# Patient Record
Sex: Male | Born: 1935 | Race: Black or African American | Hispanic: No | Marital: Single | Smoking: Heavy tobacco smoker
Health system: Southern US, Community
[De-identification: ages and names within clinical notes are randomized; demographics above are authoritative.]

## PROBLEM LIST (undated history)

## (undated) ENCOUNTER — Emergency Department (HOSPITAL_COMMUNITY): Admission: EM | Payer: Medicare Other | Source: Home / Self Care

## (undated) DIAGNOSIS — E7800 Pure hypercholesterolemia, unspecified: Secondary | ICD-10-CM

## (undated) DIAGNOSIS — I10 Essential (primary) hypertension: Secondary | ICD-10-CM

## (undated) DIAGNOSIS — N433 Hydrocele, unspecified: Secondary | ICD-10-CM

## (undated) DIAGNOSIS — M109 Gout, unspecified: Secondary | ICD-10-CM

## (undated) DIAGNOSIS — M1990 Unspecified osteoarthritis, unspecified site: Secondary | ICD-10-CM

## (undated) HISTORY — PX: BACK SURGERY: SHX140

## (undated) HISTORY — PX: HERNIA REPAIR: SHX51

---

## 2005-01-13 ENCOUNTER — Encounter: Admission: RE | Admit: 2005-01-13 | Discharge: 2005-01-13 | Payer: Self-pay | Admitting: Neurosurgery

## 2005-01-13 IMAGING — RF DG MYELOGRAM LUMBAR
13 series · 13 of 13 positions shown · IV contrast (omnipaque)
Comparison: none

CLINICAL DATA: Back and bilateral leg pain. 
 LUMBAR MYELOGRAM:
 Transitional anatomy is present, lowest disc space is considered S1-2. 
 Following informed consent, sterile preparation of the back, and adequate local anesthesia, a lumbar puncture was performed using a 22 gauge spinal needle at S1-2 in the midline.  Fluid was clear and colorless.  15 cc of Omnipaque 180 was instilled in the subarachnoid space.  AP, lateral, and oblique views demonstrate mild scoliosis convex left in the midlumbar region.  There is asymmetric loss of interspace height at L4-5 on the right and L5-S1 diffusely.  There is moderate waist-like narrowing at L3-4.  Conus medullaris is normal.

[Series 1: myelogram  white · 1 of 1 slices shown (1 of 13)]
[im 1/1]
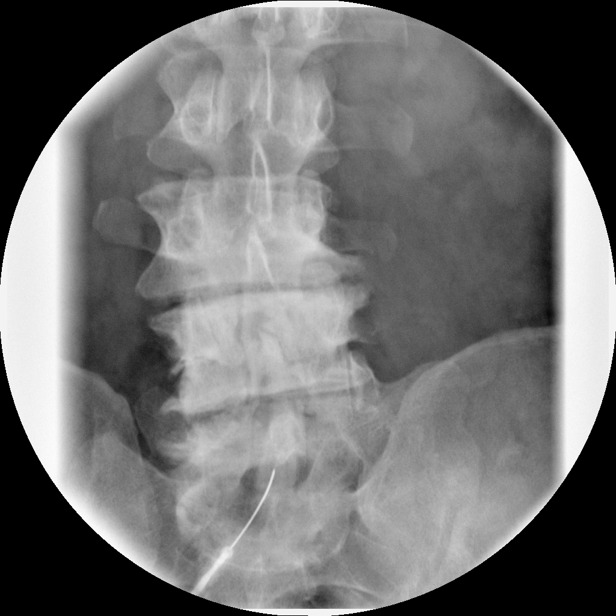

[Series 2: myelogram  white · 1 of 1 slices shown (2 of 13)]
[im 1/1]
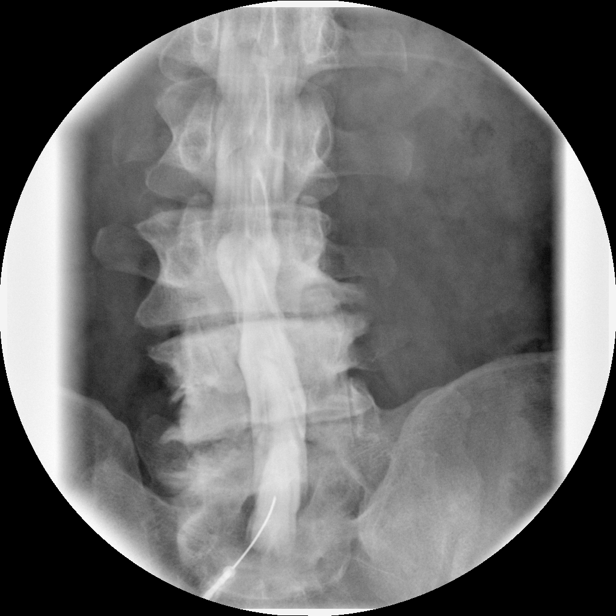

[Series 3: myelogram  white · 1 of 1 slices shown (3 of 13)]
[im 1/1]
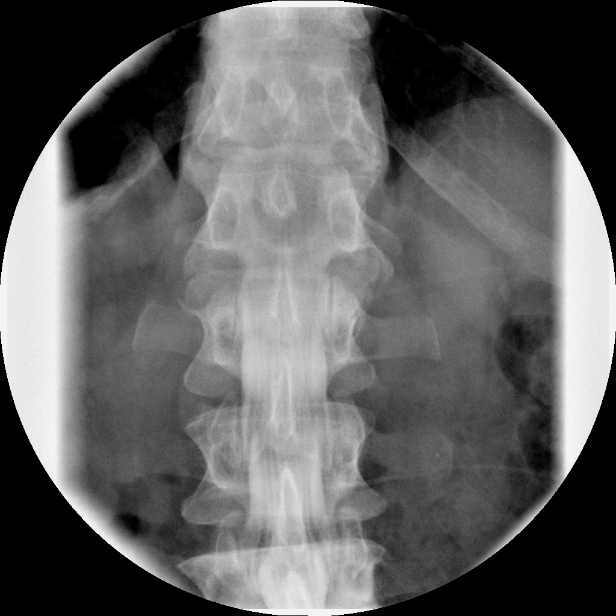

[Series 4: myelogram  white · 1 of 1 slices shown (4 of 13)]
[im 1/1]
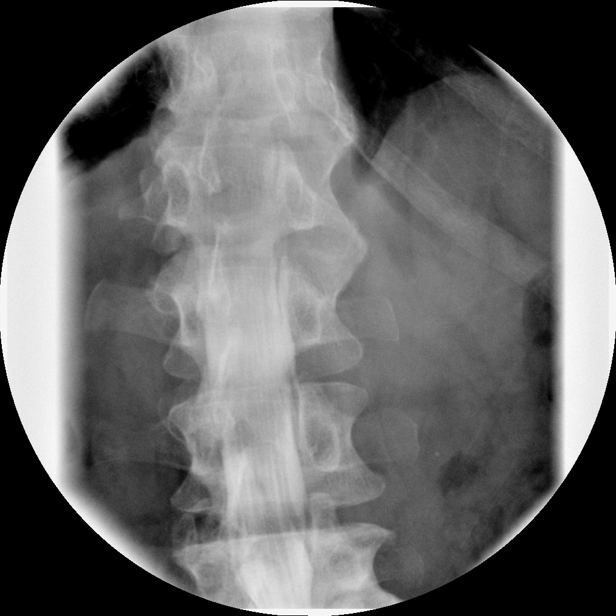

[Series 5: myelogram  white · 1 of 1 slices shown (5 of 13)]
[im 1/1]
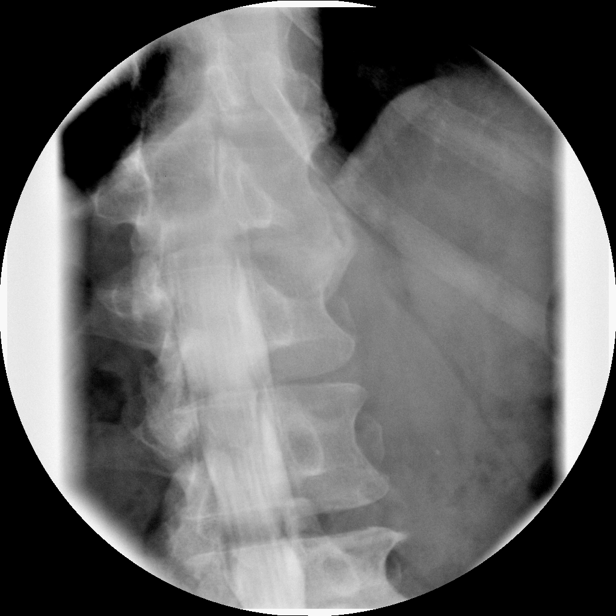

[Series 6: myelogram  white · 1 of 1 slices shown (6 of 13)]
[im 1/1]
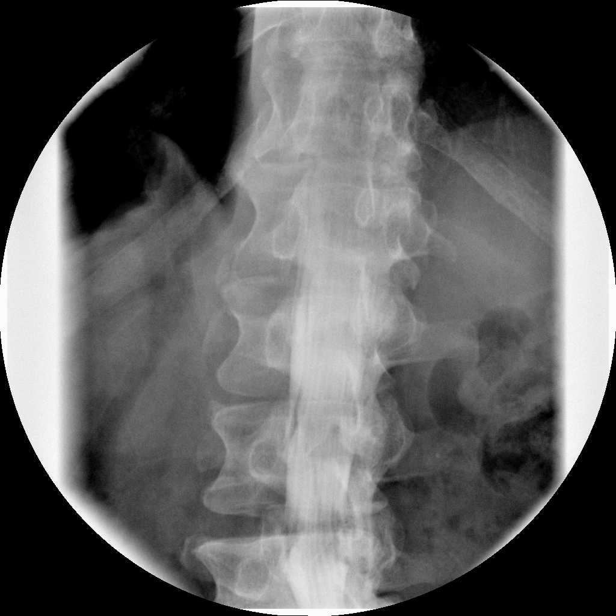

[Series 7: myelogram  white · 1 of 1 slices shown (7 of 13)]
[im 1/1]
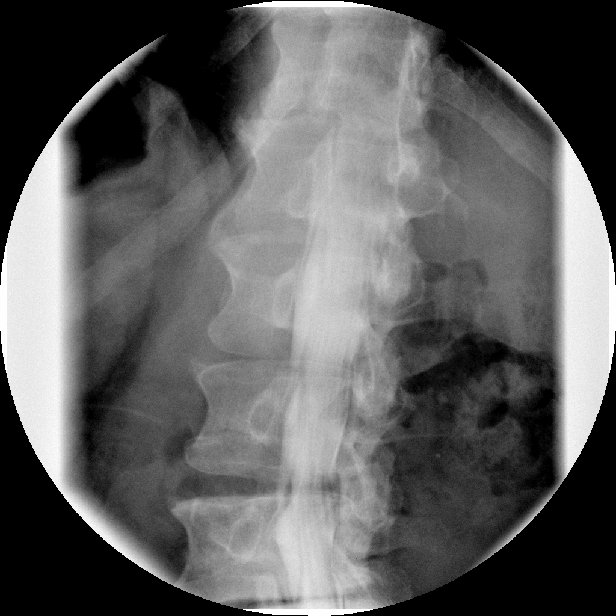

[Series 8: myelogram  white · 1 of 1 slices shown (8 of 13)]
[im 1/1]
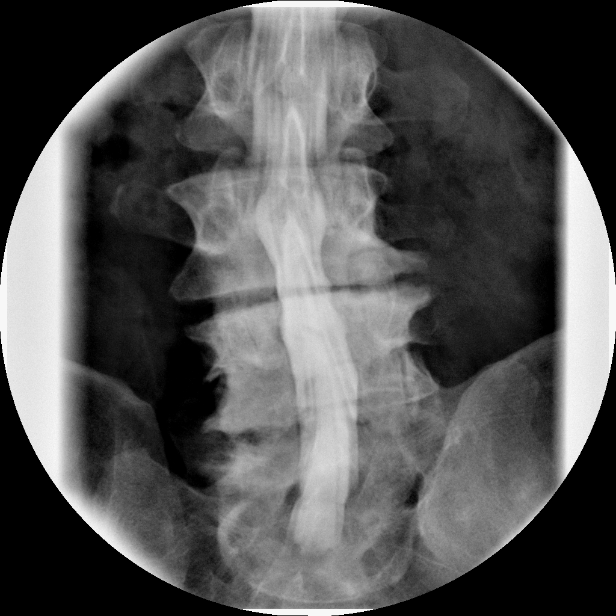

[Series 9: myelogram  white · 1 of 1 slices shown (9 of 13)]
[im 1/1]
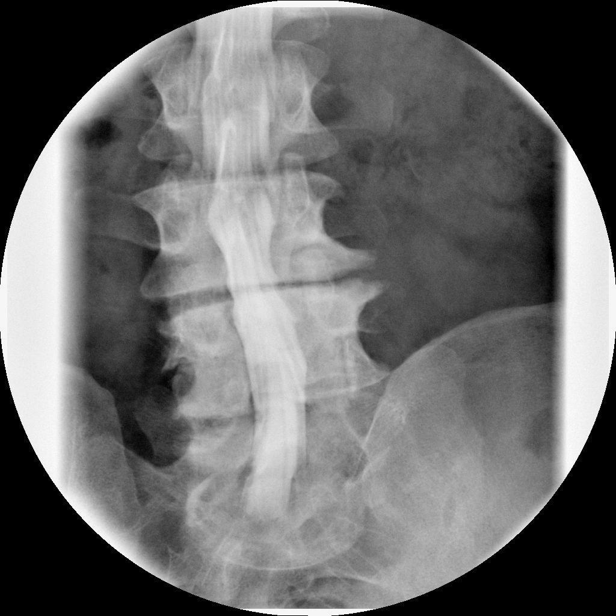

[Series 10: myelogram  white · 1 of 1 slices shown (10 of 13)]
[im 1/1]
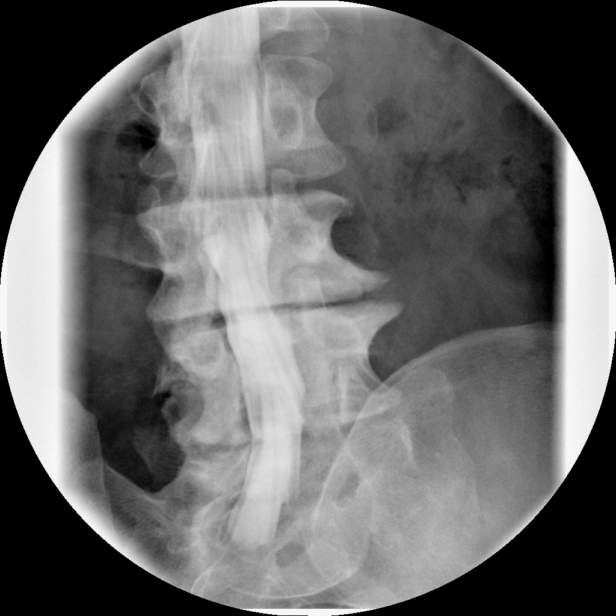

[Series 11: myelogram  white · 1 of 1 slices shown (11 of 13)]
[im 1/1]
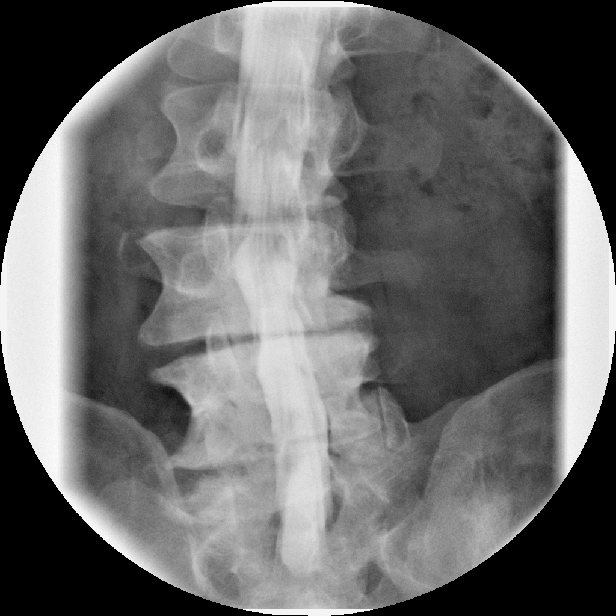

[Series 12: myelogram  white · 1 of 1 slices shown (12 of 13)]
[im 1/1]
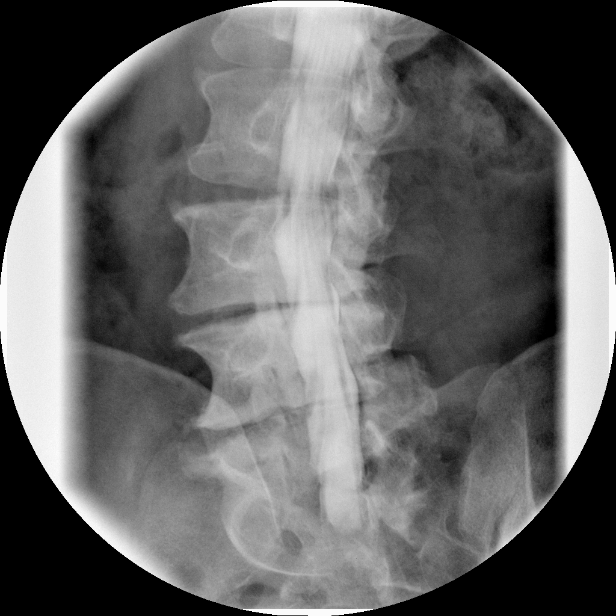

[Series 13: myelogram  white · 1 of 1 slices shown (13 of 13)]
[im 1/1]
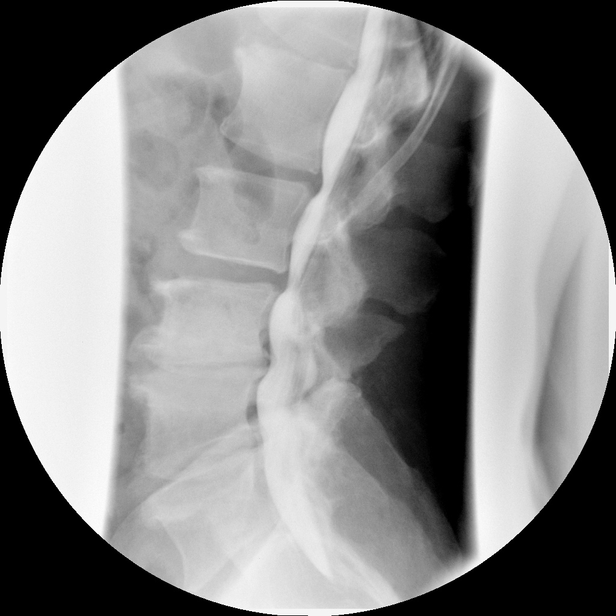

[13 of 13 positions shown; findings below may reference images not displayed]

IMPRESSION: As above.
 POST-MYELOGRAM CT:
 L1-2:  Normal interspace.
 L2-3:  Broad based central disc protrusion.  Bilateral L-3 nerve root effacement. 
 L3-4:  Significant multifactorial spinal stenosis secondary to posterior element hypertrophy and broad based disc protrusion.  Bilateral L-4 nerve root encroachment is present right greater than left. 
 L4-5:  Severe disc space narrowing with osteophyte formation and calcified protrusion central and to the right.  Moderate facet arthropathy is present.  Previous decompressive surgery has been performed.  Bilateral L-5 nerve root encroachment is present in the lateral recess.  Bilateral L-4 nerve root encroachment is present in the foramina right worse than left.  
 L5-S1:  Large disc extrusion central and to the left with free fragment upward.  Vacuum disc phenomenon is present.  Left S-1 nerve root encroachment is present in the canal.   Left L-5 nerve root encroachment is present in the foramen.  Previous decompressive laminectomy has been performed at this level.
IMPRESSION: 1.  Large disc extrusion L5-S1 central and to the left with free fragment upward; left S-1 nerve root encroachment is present along with left L-5 nerve root encroachment in the foramen.  
 2.  Significant multifactorial spinal stenosis at L4-5 secondary to disc space narrowing, central osteophyte formation, posterior element hypertrophy and broad based disc protrusion which is partially calcified; bilateral L-5 and bilateral L-4 nerve root encroachment are present right worse than left. 
 3.  Multifactorial spinal stenosis at L3-4 secondary to posterior element hypertrophy and soft disc protrusion with bilateral L-4 nerve root encroachment, left greater than right.  
 4.  Small central protrusion at L2-3 with bilateral L-3 nerve root encroachment. 
 5.  Transitional anatomy is present; see comments above.

## 2005-01-24 ENCOUNTER — Inpatient Hospital Stay (HOSPITAL_COMMUNITY): Admission: RE | Admit: 2005-01-24 | Discharge: 2005-01-25 | Payer: Self-pay | Admitting: Neurosurgery

## 2008-07-14 ENCOUNTER — Ambulatory Visit (HOSPITAL_COMMUNITY): Admission: RE | Admit: 2008-07-14 | Discharge: 2008-07-15 | Payer: Self-pay | Admitting: Neurosurgery

## 2011-01-04 NOTE — Op Note (Signed)
NAME:  Donald Bartlett, Donald Bartlett NO.:  [id number]   MEDICAL RECORD NO.:  [id number]          PATIENT TYPE:  OIB   LOCATION:  3526                         FACILITY:  [facility]   PHYSICIAN:  Kathaleen Maser. Pool, M.D.    DATE OF BIRTH:  04-24-1930   DATE OF PROCEDURE:  07/14/2008  DATE OF DISCHARGE:                               OPERATIVE REPORT   PREOPERATIVE DIAGNOSIS:  Left L2-3 herniated nucleus pulposus with  stenosis and radiculopathy.   POSTOPERATIVE DIAGNOSIS:  Left L2-3 herniated nucleus pulposus with  stenosis and radiculopathy.   PROCEDURE NOTE:  Left L2-3 decompressive laminotomy and foraminotomy,  left L2-3 microdiskectomy.   SURGEON:  Kathaleen Maser. Pool, MD   ASSISTANT:  Donalee Citrin, MD   ANESTHESIA:  General endotracheal.   PREMEDICATION:  Donald Bartlett is a 77 year old male with history of severe  back and left lower extremity pain consistent with an L3 radiculopathy.  Workup demonstrates evidence of spondylosis with stenosis off to the  left-sided at L2-3 with associated disk herniation causing marked  compression of the thecal sac and left-sided L3 nerve root.  The patient  has been counseled as to his options.  He has decided to proceed with a  left-sided L2-3 laminotomy and microdiskectomy in hopes of improving his  symptoms.   OPERATIVE NOTE:  He was brought to operating room, placed on the  operating room table in supine position.  After an adequate level of  anesthesia achieved, the patient was placed prone onto Wilson frame.  Appropriately padded the patient's lumbar region, prepped and draped  sterilely.  A 10 blade was used to make a curvilinear skin incision,  overlying the L2-3 interspace.  This was carried down sharply in  midline.  Subperiosteal dissection was then performed exposing the  lamina and facet joints at L2 and L3 on the left side.  Deep self-  retaining retractor was placed.  Intraoperative x-rays taken and level  was confirmed.  Laminotomy was  then performed using high-speed drill and  Kerrison rongeurs through the inferior aspect of the lamina of L2,  medial aspect of the L2-3 facet joints, superior rim of the L3 lamina.  Ligamentum flavum was then elevated and resected in the usual fashion  using Kerrison rongeurs.  The underlying thecal sac and exiting L3 nerve  root were identified.  Microscope was then brought into the field.  Using microdissection in the left-sided L3 nerve root underlying disk  herniation.  Epidural venous plexus coagulated and cut.  Thecal sac and  L3 nerve root were gently mobilized and retracted towards its midline.  A large disk herniation was then readily apparent.  This was then  incised with 15 blade in a rectangular fashion to widen the disk space.  Clean-out was achieved using upward angled pituitary rongeurs and  Epstein curettes.  All wounds of the disk herniation were completely  resected.  All loose or obviously degenerative disk material was then  removed from the interspace.  At this point, a very thorough  decompression had been achieved.  There was no injury to the thecal sac  and nerve roots.  Wound was then irrigated with antibiotic solution.  Gelfoam was placed topically for hemostasis, which was found be good.  Microscope and retractor system were removed.  Hemostasis was then achieved with electrocautery.  Wound was then closed  in layers with Vicryl suture.  Steri-Strips and sterile dressing were  applied.  There were no complications.  The patient tolerated the  procedure well and he returns to the recovery room postoperatively.           ______________________________  Kathaleen Maser Pool, M.D.     HAP/MEDQ  D:  07/14/2008  T:  07/14/2008  Job:  [id number]

## 2011-01-07 NOTE — Op Note (Signed)
NAME:  Donald Bartlett, Donald Bartlett NO.:  [id number]   MEDICAL RECORD NO.:  [id number]          PATIENT TYPE:  INP   LOCATION:  2864                         FACILITY:  [facility]   PHYSICIAN:  Kathaleen Maser. Pool, M.D.    DATE OF BIRTH:  1930-01-05   DATE OF PROCEDURE:  01/24/2005  DATE OF DISCHARGE:                                 OPERATIVE REPORT   SERVICE:  Neurosurgery.   PREOPERATIVE DIAGNOSES:  Left L5-S1 recurrent herniated nucleus pulposus  with radiculopathy.   POSTOPERATIVE DIAGNOSES:  Left L5-S1 recurrent herniated nucleus pulposus  with radiculopathy.   PROCEDURE:  Left L5-S1 re-exploration of laminotomy with redo  microdiscectomy.   SURGEON:  Kathaleen Maser. Pool, M.D.   ASSISTANT:  Donalee Citrin, M.D.   ANESTHESIA:  General endotracheal.   INDICATIONS FOR PROCEDURE:  [name] is a 77 year old male status post  extensive lumbar surgery done by another physician.  The patient has  evidence of transitional anatomy with six lumbar vertebra.  At the L5-L6  vertebral level, the patient has evidence of recurrent stenosis and a  leftward L5-L6 disc herniation causing marked compression of the left sided  nerve root.  The patient has been counseled as to his options.  He has  decided to proceed with re-exploration of his laminotomy with redo  microdiscectomy.   DESCRIPTION OF PROCEDURE:  The patient was brought to the operating room and  placed on the table in a supine position.  After an adequate level of  anesthesia was achieved, the patient was positioned prone on the Wilson  frame and appropriately padded for operation in the lumbar region.  He was  prepped and draped sterilely.  A 10 blade was used to make a linear skin  incision overlying the L5-L6 level.  This was carried down sharply in the  midline.  Subperiosteal dissection was performed exposing the lamina and  facet joints at L5 and L6 on the left side.  Deep self-retaining retractors  were placed.  Interoperative  x-ray was taken and the level was confirmed.   The patient's previous laminotomy was dissected free using dental  instruments.  The laminotomy was then widened using the high speed drill.  This was further widened using Kerrison rongeurs.  The epidural scar was  dissected free.  The microscope was brought onto the field for  microdissection.  The L6 nerve root was identified.  This was tracked  cephalad and retracted towards the midline.  The disc herniation was readily  apparent.  This was then incised with a 15 blade in a rectangular fashion.  There was also a fractured element of the inferior facet of L5 which was  compressing the left sided L6 nerve root which was also resected.  At this  point, a very thorough decompression of the left sided L6  nerve root had  been achieved.  There was no evidence of injury to the thecal sac or nerve  root.  The wound was then irrigated with antibiotic solution.  Gelfoam was  placed topically for hemostasis which was found to be good.  The microscope  and retractor system were removed.  Hemostasis in the muscle was achieved  with  electrocautery.  The wounds were closed in layers with Vicryl sutures.  Steri-Strips and sterile dressing were applied.  There were no  complications.  The patient tolerated the procedure well and he was returned  to the recovery room in stable condition.      HAP/MEDQ  D:  01/24/2005  T:  01/24/2005  Job:  [id number]

## 2011-03-02 ENCOUNTER — Ambulatory Visit (HOSPITAL_COMMUNITY): Payer: Medicare Other

## 2011-03-02 ENCOUNTER — Ambulatory Visit (HOSPITAL_COMMUNITY)
Admission: RE | Admit: 2011-03-02 | Discharge: 2011-03-02 | Disposition: A | Discharge status: Home / Self Care | Payer: Medicare Other | Source: Ambulatory Visit | Attending: Cardiovascular Disease | Admitting: Cardiovascular Disease

## 2011-03-02 DIAGNOSIS — Z01818 Encounter for other preprocedural examination: Secondary | ICD-10-CM | POA: Insufficient documentation

## 2011-03-02 DIAGNOSIS — R0989 Other specified symptoms and signs involving the circulatory and respiratory systems: Secondary | ICD-10-CM | POA: Insufficient documentation

## 2011-03-02 DIAGNOSIS — E785 Hyperlipidemia, unspecified: Secondary | ICD-10-CM | POA: Insufficient documentation

## 2011-03-02 DIAGNOSIS — Z01812 Encounter for preprocedural laboratory examination: Secondary | ICD-10-CM | POA: Insufficient documentation

## 2011-03-02 DIAGNOSIS — F430 Acute stress reaction: Secondary | ICD-10-CM | POA: Insufficient documentation

## 2011-03-02 DIAGNOSIS — R9439 Abnormal result of other cardiovascular function study: Secondary | ICD-10-CM | POA: Insufficient documentation

## 2011-03-02 DIAGNOSIS — Z79899 Other long term (current) drug therapy: Secondary | ICD-10-CM | POA: Insufficient documentation

## 2011-03-02 DIAGNOSIS — R0609 Other forms of dyspnea: Secondary | ICD-10-CM | POA: Insufficient documentation

## 2011-03-02 DIAGNOSIS — I10 Essential (primary) hypertension: Secondary | ICD-10-CM | POA: Insufficient documentation

## 2011-03-02 DIAGNOSIS — I2510 Atherosclerotic heart disease of native coronary artery without angina pectoris: Secondary | ICD-10-CM | POA: Insufficient documentation

## 2011-03-02 DIAGNOSIS — Z01810 Encounter for preprocedural cardiovascular examination: Secondary | ICD-10-CM | POA: Insufficient documentation

## 2011-03-02 DIAGNOSIS — K2270 Barrett's esophagus without dysplasia: Secondary | ICD-10-CM | POA: Insufficient documentation

## 2011-03-02 LAB — CBC
MCH: 29.1 pg (ref 26.0–34.0)
MCHC: 33.6 g/dL (ref 30.0–36.0)
MCV: 86.8 fL (ref 78.0–100.0)
Platelets: 185 10*3/uL (ref 150–400)

## 2011-03-02 LAB — BASIC METABOLIC PANEL
BUN: 10 mg/dL (ref 6–23)
Chloride: 107 mEq/L (ref 96–112)
Creatinine, Ser: 1.13 mg/dL (ref 0.50–1.35)
GFR calc Af Amer: >60 mL/min (ref >60)
Glucose, Bld: 94 mg/dL (ref 70–99)
Potassium: 4.2 mEq/L (ref 3.5–5.1)

## 2011-03-02 LAB — PROTIME-INR: Prothrombin Time: 13.4 seconds (ref 11.6–15.2)

## 2011-03-16 NOTE — H&P (Signed)
NAME:  Donald Bartlett, HILLMER NO.:  [id number]  MEDICAL RECORD NO.:  [id number]  LOCATION:  [facility]                         FACILITY:  [facility]  PHYSICIAN:  Nanetta Batty, M.D.   DATE OF BIRTH:  12/07/1929  DATE OF ADMISSION:  03/02/2011 DATE OF DISCHARGE:                             HISTORY & PHYSICAL   CHIEF COMPLAINT:  Dyspnea on exertion.  HISTORY OF PRESENT ILLNESS:  [name] is a 77 year old male who was referred to [place] by Dr. Konrad Felix at [facility].  The patient has a long history of dyspnea on exertion.  He actually had a stress test and a CT angiogram of his chest in 2009, which apparently were unrevealing.  He has been evaluated by a pulmonologist in [facility] and told he was "okay."  I do not have those records.  He continues to have dyspnea on exertion and recently underwent an echocardiogram and Myoview.  The Myoview was abnormal with a perfusion abnormality in the mid anterolateral wall.  Echocardiogram revealed a moderate LVH with diastolic dysfunction and EF of 60%.  RV function was normal.  There was mildly elevated pulmonary pressures.  The patient does have multiple risk factors for coronary disease.  He is admitted now for diagnostic catheterization further evaluation.  He denies any history of chest pain or anginal symptoms.  His main complaints are dyspnea on exertion.  He does say he was waking up short of breath at night, but this improved once he stopped smoking a month ago.  PAST MEDICAL HISTORY:  Remarkable for gastritis, dyslipidemia, COPD by history, hypertension, Barrett esophagus, DJD with back surgery twice in 2006 and 2009, and hernia repair in 2006.  CURRENT MEDICATIONS: 1. Lipitor 5 mg at bedtime. 2. Zantac 150 daily. 3. Mobic 15 mg daily.  ALLERGIES:  He is allergic to PENICILLIN which caused a rash.  SOCIAL HISTORY:  He is divorced, he says he has 20 children and many grandchildren and great-grandchildren.  He smoked 2  packs a day since he was a teenager, but quit a month ago.  He worked in [employer] and is retired.  FAMILY HISTORY:  Unremarkable for coronary disease.  REVIEW OF SYSTEMS:  He denies any history of GI bleeding or melena.  He has not had kidney disease or kidney problems.  He has not had syncope. He has been told in the past that he has an irregular and sometimes slow heart rate.  PHYSICAL EXAMINATION:  VITAL SIGNS:  Blood pressure 160/86, pulse 75, temp 97.2 and respirations 12. GENERAL:  He is a well-developed, well-nourished male, in no acute distress. HEENT: Normocephalic.  He has poor dentition. NECK:  Without JVD or bruit. CHEST:  Clear lung fields with no wheezing. CARDIAC:  Regular rate and rhythm without obvious murmur, rub or gallop. Normal S1 and S2. ABDOMEN:  Nontender and nondistended. EXTREMITIES:  No edema.  Distal pulses are faint.  There are no femoral bruits noted. NEURO:  Grossly intact.  He is awake, alert, oriented and cooperative. Moves all extremities without obvious deficit. SKIN:  Cool and dry.  EKG shows sinus rhythm, left axis deviation, no acute changes.  Renal function and  Hematology profile are within normal limits.  INR is within normal limits as well, see lab results for complete details.  IMPRESSION: 1. Dyspnea on exertion with abnormal Myoview, rule out anginal     equivalent. 2. Treated hypertension with left ventricular hypertrophy and     diastolic dysfunction by echo. 3. Long history of smoking. 4. Treated dyslipidemia.  PLAN:  The patient is admitted now for elective catheterization and further evaluation.     Abelino Derrick, P.A.   ______________________________ Nanetta Batty, M.D.    Lenard Lance  D:  03/02/2011  T:  03/02/2011  Job:  [id number]  cc:   Roxanne Mins, PA-C  Electronically Signed by Corine Shelter P.A. on 03/08/2011 12:02:10 PM Electronically Signed by Nanetta Batty M.D. on 03/16/2011 03:19:43 PM

## 2011-03-16 NOTE — Cardiovascular Report (Signed)
  NAME:  [name], [name] NO.:  [id number]  MEDICAL RECORD NO.:  [id number]  LOCATION:  [facility]                         FACILITY:  [facility]  PHYSICIAN:  Nanetta Batty, M.D.   DATE OF BIRTH:  1930/03/08  DATE OF PROCEDURE: DATE OF DISCHARGE:  03/02/2011                           CARDIAC CATHETERIZATION   Donald Bartlett is a 77 year old mildly overweight African American male with history of hypertension, hyperlipidemia, GERD, and Barrett's esophagus referred for diagnostic coronary arteriography to define his anatomy because of an abnormal Myoview showing anterolateral ischemia.  The patient was brought to the second floor [place] [facility] in the postabsorptive state.  He was premedicated with p.o. Valium.  His right groin was prepped and shaved in the usual sterile fashion.  A 1% Xylocaine was used for local anesthesia.  A 5-French sheath was inserted into the right femoral artery using standard Seldinger technique.  A 5- French right-left Judkins diagnostic catheter as well as 5-French pigtail catheter, and AL-1 catheters were used for selective coronary angiography, left ventriculography respectively.  A Visipaque dye was used for entirety of the case.  Aortic, left ventricular, and pullback pressures were recorded.  HEMODYNAMICS: 1. Aortic systolic pressure 160, diastolic pressure 89. 2. Left ventricular systolic pressure 159 and end-diastolic pressure     21.  SELECTIVE CHOLANGIOGRAPHY: 1. Left main normal. 2. LAD; LAD was essentially normal with mild irregularities. 3. Left circumflex; nondominant and free of significant disease. 4. Right coronary artery; this a dominant vessel with anterior takeoff     that was selectively catheterized with an AL-1 catheter.  There was     approximately 50% stenosis in the midportion which did not appear     to be hemodynamically significant. 5. Left ventriculography; RAO left ventriculogram was performed using  25 mL of Visipaque dye at 12 mL per second.  The overall LVEF was     estimated approximately at 35-40% with some moderate global     hypokinesia.  IMPRESSION:  [name] has noncritical coronary artery disease with moderate left ventricular dysfunction.  This certainly could explain his dyspnea, is not ischemically mediated.  Continued medical therapy will be recommended.  Sheath was removed and pressures was held in the groin to achieve hemostasis.  The patient left the lab in stable condition.  He will remain recumbent for 5 hours and will be discharged home later today as an outpatient.  He will follow up with Dr. Carollee Herter.     Nanetta Batty, M.D.     JB/MEDQ  D:  03/02/2011  T:  03/03/2011  Job:  [id number]  cc:   [facility] Carollee Herter, DO Second floor [facility]  Electronically Signed by Nanetta Batty M.D. on 03/16/2011 03:19:46 PM

## 2011-05-24 LAB — CBC
HCT: 45.9
MCHC: 32.7
MCV: 89.4
Platelets: 178
RDW: 14.4
WBC: 6.3

## 2011-05-24 LAB — DIFFERENTIAL
Basophils Absolute: 0.1
Eosinophils Absolute: 0
Eosinophils Relative: 0
Lymphs Abs: 2.2
Monocytes Absolute: 0.4
Neutrophils Relative %: 58

## 2011-05-24 LAB — NO BLOOD PRODUCTS

## 2011-05-24 LAB — BASIC METABOLIC PANEL
BUN: 13
Chloride: 104
Creatinine, Ser: 1.1
Glucose, Bld: 89
Potassium: 4.5

## 2014-08-27 DIAGNOSIS — N433 Hydrocele, unspecified: Secondary | ICD-10-CM | POA: Insufficient documentation

## 2014-08-27 DIAGNOSIS — N451 Epididymitis: Secondary | ICD-10-CM | POA: Insufficient documentation

## 2014-08-27 DIAGNOSIS — N390 Urinary tract infection, site not specified: Secondary | ICD-10-CM | POA: Insufficient documentation

## 2014-09-16 DIAGNOSIS — N390 Urinary tract infection, site not specified: Secondary | ICD-10-CM | POA: Insufficient documentation

## 2014-09-16 DIAGNOSIS — R3129 Other microscopic hematuria: Secondary | ICD-10-CM | POA: Insufficient documentation

## 2014-09-25 DIAGNOSIS — A5401 Gonococcal cystitis and urethritis, unspecified: Secondary | ICD-10-CM | POA: Insufficient documentation

## 2014-09-25 DIAGNOSIS — N329 Bladder disorder, unspecified: Secondary | ICD-10-CM | POA: Insufficient documentation

## 2014-09-25 DIAGNOSIS — N2889 Other specified disorders of kidney and ureter: Secondary | ICD-10-CM | POA: Insufficient documentation

## 2014-09-25 DIAGNOSIS — N37 Urethral disorders in diseases classified elsewhere: Secondary | ICD-10-CM

## 2014-11-25 DIAGNOSIS — N3080 Other cystitis without hematuria: Secondary | ICD-10-CM | POA: Insufficient documentation

## 2015-06-02 ENCOUNTER — Other Ambulatory Visit: Payer: Self-pay | Admitting: Urology

## 2015-06-02 DIAGNOSIS — N2889 Other specified disorders of kidney and ureter: Secondary | ICD-10-CM

## 2015-06-11 ENCOUNTER — Ambulatory Visit
Admission: RE | Admit: 2015-06-11 | Discharge: 2015-06-11 | Disposition: A | Discharge status: Home / Self Care | Payer: Medicare Other | Source: Ambulatory Visit | Attending: Urology | Admitting: Urology

## 2015-06-11 DIAGNOSIS — N2889 Other specified disorders of kidney and ureter: Secondary | ICD-10-CM | POA: Insufficient documentation

## 2015-06-11 HISTORY — DX: Unspecified osteoarthritis, unspecified site: M19.90

## 2015-06-11 HISTORY — DX: Hydrocele, unspecified: N43.3

## 2015-06-11 HISTORY — DX: Pure hypercholesterolemia, unspecified: E78.00

## 2015-06-11 HISTORY — DX: Essential (primary) hypertension: I10

## 2015-06-11 HISTORY — DX: Gout, unspecified: M10.9

## 2015-06-11 NOTE — Consult Note (Signed)
Chief Complaint: Right renal mass.  Patient was seen in consultation today for cryoablation of right renal mass at the request of Eskew,Lawrence A  Referring Physician(s): Eskew,Lawrence A  History of Present Illness: Donald Bartlett is a 81 y.o. male who has been followed by Dr. Estill Dooms for a right renal mass as well as a symptomatic left hydrocele. An enhancing right renal mass was first directed by CT performed for microscopic hematuria on 09/25/2014 and measured approximately 2.4 x 2.0 cm, located in the medial aspect of the mid to upper kidney with predominantly endophytic location and mildly exophytic medial contour. This was followed up with another CT on 06/02/2015 demonstrating slight interval enlargement with dimensions of approximately 2.6 x 2.4 cm. The mass appears solid and laterally nearly abuts the collecting system. It is located just superior to the renal pelvis. No renal vein involvement or regional lymphadenopathy are identified by CT.   The right renal mass is asymptomatic. The patient complains of significant discomfort from a left hydrocele and states that he cannot sleep at night due to scrotal discomfort. Prior scrotal ultrasound has not shown evidence of a testicular mass.  Past Medical History  Diagnosis Date  . Hypertension   . Hypercholesteremia   . Gout   . Arthritis   . Hydrocele     Past Surgical History  Procedure Laterality Date  . Hernia repair    . Back surgery      Allergies: Penicillins  Medications: Prior to Admission medications   Medication Sig Start Date End Date Taking? Authorizing Provider  aspirin EC 81 MG tablet Take 81 mg by mouth daily.   Yes Historical Provider, MD  HYDROcodone-acetaminophen (NORCO/VICODIN) 5-325 MG tablet Take 1 tablet by mouth every 6 (six) hours as needed for moderate pain.   Yes Historical Provider, MD  lisinopril (PRINIVIL,ZESTRIL) 10 MG tablet Take 10 mg by mouth daily.   Yes Historical Provider, MD    simvastatin (ZOCOR) 40 MG tablet Take 40 mg by mouth daily.   Yes Historical Provider, MD     No family history on file.  Social History   Social History  . Marital Status: Single    Spouse Name: N/A  . Number of Children: N/A  . Years of Education: N/A   Social History Main Topics  . Smoking status: Heavy Tobacco Smoker -- 2.00 packs/day    Types: Cigarettes    Start date: 06/10/1949  . Smokeless tobacco: Former Systems developer    Types: Snuff, Chew  . Alcohol Use: No  . Drug Use: None  . Sexual Activity: Not Asked   Other Topics Concern  . None   Social History Narrative  . None    Review of Systems: A 12 point ROS discussed and pertinent positives are indicated in the HPI above.  All other systems are negative.  Review of Systems  Constitutional: Negative.   Respiratory: Negative.   Cardiovascular: Negative.   Gastrointestinal: Negative.   Genitourinary: Positive for scrotal swelling. Negative for dysuria, frequency, flank pain and difficulty urinating.  Musculoskeletal: Negative.   Neurological: Negative.      Vital Signs: BP 123/78 mmHg  Pulse 97  Temp(Src) 97.8 F (36.6 C) (Oral)  Resp 14  Ht '5\' 9"'  (1.753 m)  Wt 215 lb (97.523 kg)  BMI 31.74 kg/m2  SpO2 97%  Physical Exam  Constitutional: He is oriented to person, place, and time. He appears well-developed and well-nourished. No distress.  Neck: Neck supple. No  JVD present.  Cardiovascular: Normal rate, regular rhythm and normal heart sounds.  Exam reveals no gallop and no friction rub.   No murmur heard. Pulmonary/Chest: Effort normal and breath sounds normal. No stridor. No respiratory distress. He has no wheezes. He has no rales.  Abdominal: Soft. He exhibits no distension and no mass. There is no tenderness. There is no rebound and no guarding.  Musculoskeletal: He exhibits no edema.  Neurological: He is alert and oriented to person, place, and time.  Skin: He is not diaphoretic.  Nursing note and  vitals reviewed.   Imaging: No results found.  Labs:  CBC: No results for input(s): WBC, HGB, HCT, PLT in the last 8760 hours.  COAGS: No results for input(s): INR, APTT in the last 8760 hours.  BMP: Renal function on 05/22/15: Creatinine 1.2 Estimated GFR 74 mL/min  LIVER FUNCTION TESTS: No results for input(s): BILITOT, AST, ALT, ALKPHOS, PROT, ALBUMIN in the last 8760 hours.  TUMOR MARKERS: No results for input(s): AFPTM, CEA, CA199, CHROMGRNA in the last 8760 hours.  Assessment and Plan:  I met with Donald Bartlett and we reviewed the prior CT studies. The enhancing right renal mass demonstrates mild enlargement over an eight-month interval and is clearly very likely malignant. The patient's main complaint currently is the symptomatic left hydrocele and he stated today that he would like to have a left hydrocelectomy prior to treating the right renal mass.  I reviewed treatment options for the right renal mass including percytaneous cryoablation. Based on the patient's age and his long-standing smoking history, history of hypertension and prior history of TIA, a less invasive and nephron sparing approach would be indicated.  After discussing cryoablation, Donald Bartlett is agreeable to proceeding with scheduling a CT-guided cryoablation procedure at [facility]. Biopsy would be performed at the same time to establish a tissue diagnosis. We will begin the scheduling process. Donald Bartlett would like to have surgery for his symptomatic left hydrocele prior to ablating the right renal mass. I told him that that would be fine since the hydrocele seems to be so symptomatic.  Thank you for this interesting consult.  I greatly enjoyed meeting Donald Bartlett and look forward to participating in their care.  A copy of this report was sent to the requesting provider on this date.  SignedAletta Edouard T 06/11/2015, 2:21 PM   I spent a total of 40 Minutes in face to face in clinical  consultation, greater than 50% of which was counseling/coordinating care for a right renal mass.

## 2015-07-10 ENCOUNTER — Other Ambulatory Visit: Payer: Self-pay | Admitting: Physician Assistant

## 2015-07-10 DIAGNOSIS — N2889 Other specified disorders of kidney and ureter: Secondary | ICD-10-CM

## 2015-07-14 ENCOUNTER — Encounter: Payer: Self-pay | Admitting: Radiology

## 2015-07-14 ENCOUNTER — Other Ambulatory Visit: Payer: Self-pay | Admitting: Radiology

## 2015-07-14 DIAGNOSIS — N2889 Other specified disorders of kidney and ureter: Secondary | ICD-10-CM

## 2015-07-24 LAB — CREATININE WITH EST GFR
CREATININE: 1.1 mg/dL (ref 0.70–1.11)
GFR, EST NON AFRICAN AMERICAN: 62 mL/min (ref >=60)
GFR, Est African American: 71 mL/min (ref >=60)

## 2015-07-24 LAB — BUN: BUN: 12 mg/dL (ref 7–25)

## 2015-07-29 ENCOUNTER — Ambulatory Visit
Admission: RE | Admit: 2015-07-29 | Discharge: 2015-07-29 | Disposition: A | Discharge status: Home / Self Care | Payer: Medicare Other | Source: Ambulatory Visit | Attending: Physician Assistant | Admitting: Physician Assistant

## 2015-07-29 ENCOUNTER — Other Ambulatory Visit (HOSPITAL_COMMUNITY): Payer: Self-pay | Admitting: Interventional Radiology

## 2015-07-29 DIAGNOSIS — N2889 Other specified disorders of kidney and ureter: Secondary | ICD-10-CM

## 2015-07-29 DIAGNOSIS — H9190 Unspecified hearing loss, unspecified ear: Secondary | ICD-10-CM | POA: Insufficient documentation

## 2015-07-29 DIAGNOSIS — C649 Malignant neoplasm of unspecified kidney, except renal pelvis: Secondary | ICD-10-CM | POA: Insufficient documentation

## 2015-07-29 NOTE — Progress Notes (Signed)
Chief Complaint: Status post percutaneous cryoablation of a right renal carcinoma on 07/09/2015.  History of Present Illness: Donald Bartlett is a 81 y.o. male 3 weeks post her case cryoablation of a right renal carcinoma. Core biopsy was performed of a 2.6 cm mass emanating from the medial mid to upper right kidney just prior to treatment with cryoablation. 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  Following the procedure, Donald Bartlett complains of right-sided back pain which is fairly constant and makes it difficult to lie on his back. He has had poor sleep as a result of the pain. He has not been taking any medicine for pain and states that he did not leave a message on the clinic phone as there was not an answer. He denies any urinary symptoms or fever.   Past Medical History  Diagnosis Date  . Hypertension   . Hypercholesteremia   . Gout   . Arthritis   . Hydrocele     Past Surgical History  Procedure Laterality Date  . Hernia repair    . Back surgery      Allergies: Penicillins  Medications: Prior to Admission medications   Medication Sig Start Date End Date Taking? Authorizing Provider  aspirin EC 81 MG tablet Take 81 mg by mouth daily.    Historical Provider, MD  HYDROcodone-acetaminophen (NORCO/VICODIN) 5-325 MG tablet Take 1 tablet by mouth every 6 (six) hours as needed for moderate pain.    Historical Provider, MD  lisinopril (PRINIVIL,ZESTRIL) 10 MG tablet Take 10 mg by mouth daily.    Historical Provider, MD  simvastatin (ZOCOR) 40 MG tablet Take 40 mg by mouth daily.    Historical Provider, MD     No family history on file.  Social History   Social History  . Marital Status: Single    Spouse Name: N/A  . Number of Children: N/A  . Years of Education: N/A   Social History Main Topics  . Smoking status: Heavy Tobacco Smoker --  2.00 packs/day    Types: Cigarettes    Start date: 06/10/1949  . Smokeless tobacco: Former Systems developer    Types: Snuff, Chew  . Alcohol Use: No  . Drug Use: Not on file  . Sexual Activity: Not on file   Other Topics Concern  . Not on file   Social History Narrative  . No narrative on file    ECOG Status: 1 - Symptomatic but completely ambulatory  Review of Systems: A 12 point ROS discussed and pertinent positives are indicated in the HPI above.  All other systems are negative.  Review of Systems  Constitutional: Negative.   Eyes: Negative.   Respiratory: Negative.   Cardiovascular: Negative.   Gastrointestinal: Negative.   Genitourinary: Positive for flank pain. Negative for dysuria, frequency, hematuria, discharge, penile swelling, scrotal swelling, enuresis, difficulty urinating, penile pain and testicular pain.  Musculoskeletal: Positive for back pain. Negative for myalgias, joint swelling, arthralgias and gait problem.  Neurological: Negative.     Vital Signs: BP 156/87 mmHg  Pulse 97  Temp(Src) 98.2 F (36.8 C)  Resp 20  Physical Exam  Abdominal: Soft. He exhibits no distension.  Tender in right paraspinal region of upper lumbar area with palpable muscle spasm/tightness.    Imaging: No results found.  Labs:  CBC: No results for input(s): WBC, HGB, HCT, PLT in the last 8760 hours.  COAGS:  No results for input(s): INR, APTT in the last 8760 hours.  BMP:  Recent Labs  07/23/15 1027  BUN 12  CREATININE 1.10  GFRNONAA 62  GFRAA 71    LIVER FUNCTION TESTS: No results for input(s): BILITOT, AST, ALT, ALKPHOS, PROT, ALBUMIN in the last 8760 hours.  TUMOR MARKERS: No results for input(s): AFPTM, CEA, CA199, CHROMGRNA in the last 8760 hours.  Assessment and Plan:  Donald Bartlett is having focal pain with palpable muscle spasm in the region of needle placement for cryoablation and biopsy. The tumor was medially located and hydrodissection was performed between  the mass and the hemidiaphragm and spine medially prior to cryoablation. Pain is most likely related to paraspinal muscle spasm as well as potential regional inflammation after ablation.  Donald Bartlett was given a prescription for Norco 5/325 mg (#30) and metaxalone 800 mg (#30).  He will call the clinic back if his pain is not improved over the next couple weeks. I recommended follow-up CT of the abdomen to evaluate the ablation site in approximately 2 months.   SignedAletta Edouard T 07/29/2015, 1:07 PM     I spent a total of 15 Minutes in face to face in clinical consultation, greater than 50% of which was counseling/coordinating care status post cryoablation of a right renal carcinoma.

## 2015-07-29 NOTE — Progress Notes (Addendum)
Patient at GI for 3-week follow up right renal cryoablation.  His only complaint is that his back "is killing" him.  He hurts all the time and can't get any sleep due to the pain.  States his appetite is fine and his bowels are moving okay.  Denies fever, sweats or any other complaints.  Dr. Kathlene Cote aware and has prescribed pain medication and a muscle relaxer.  Gypsy Lore, RN

## 2015-08-04 ENCOUNTER — Other Ambulatory Visit: Payer: Medicare Other

## 2015-09-10 ENCOUNTER — Other Ambulatory Visit (HOSPITAL_COMMUNITY): Payer: Self-pay | Admitting: Interventional Radiology

## 2015-09-10 ENCOUNTER — Other Ambulatory Visit: Payer: Self-pay | Admitting: *Deleted

## 2015-09-10 DIAGNOSIS — N2889 Other specified disorders of kidney and ureter: Secondary | ICD-10-CM

## 2015-09-29 ENCOUNTER — Inpatient Hospital Stay: Admission: RE | Admit: 2015-09-29 | Payer: Medicare Other | Source: Ambulatory Visit

## 2015-09-30 ENCOUNTER — Encounter: Payer: Self-pay | Admitting: Radiology

## 2015-10-20 ENCOUNTER — Ambulatory Visit
Admission: RE | Admit: 2015-10-20 | Discharge: 2015-10-20 | Disposition: A | Discharge status: Home / Self Care | Payer: Medicare Other | Source: Ambulatory Visit | Attending: Interventional Radiology | Admitting: Interventional Radiology

## 2015-10-20 DIAGNOSIS — N2889 Other specified disorders of kidney and ureter: Secondary | ICD-10-CM

## 2015-10-20 HISTORY — PX: IR GENERIC HISTORICAL: IMG1180011

## 2015-11-03 NOTE — Progress Notes (Signed)
Chief Complaint: 3 months status post percutaneous cryoablation of a right renal carcinoma.  History of Present Illness: Donald Bartlett is a 82 y.o. male status post percutaneous cryoablation of a right renal chromophobe carcinoma on 07/09/2015. His complaint of right-sided back pain has improved since he was last seen in December. He now complains of some periodic significant itching in the right flank region which sometimes turns into a burning sensation. He denies any numbness. He has no urinary complaints.  Past Medical History  Diagnosis Date  . Hypertension   . Hypercholesteremia   . Gout   . Arthritis   . Hydrocele     Past Surgical History  Procedure Laterality Date  . Hernia repair    . Back surgery      Allergies: Penicillins  Medications: Prior to Admission medications   Medication Sig Start Date End Date Taking? Authorizing Provider  aspirin EC 81 MG tablet Take 81 mg by mouth daily.   Yes Historical Provider, MD  HYDROcodone-acetaminophen (NORCO/VICODIN) 5-325 MG tablet Take 1 tablet by mouth every 6 (six) hours as needed for moderate pain.   Yes Historical Provider, MD  lisinopril (PRINIVIL,ZESTRIL) 10 MG tablet Take 10 mg by mouth daily.   Yes Historical Provider, MD  simvastatin (ZOCOR) 40 MG tablet Take 40 mg by mouth daily.   Yes Historical Provider, MD     No family history on file.  Social History   Social History  . Marital Status: Single    Spouse Name: N/A  . Number of Children: N/A  . Years of Education: N/A   Social History Main Topics  . Smoking status: Heavy Tobacco Smoker -- 2.00 packs/day    Types: Cigarettes    Start date: 06/10/1949  . Smokeless tobacco: Former Systems developer    Types: Snuff, Chew  . Alcohol Use: No  . Drug Use: Not on file  . Sexual Activity: Not on file   Other Topics Concern  . Not on file   Social History Narrative  . No narrative on file     Review of Systems: A 12 point ROS discussed and pertinent  positives are indicated in the HPI above.  All other systems are negative.  Review of Systems  Constitutional: Negative.   Respiratory: Negative.   Cardiovascular: Negative.   Gastrointestinal: Negative.   Genitourinary: Negative.   Musculoskeletal: Negative.   Skin:       Itching of skin in right flank/back region.  Neurological: Negative.      Vital Signs: BP 154/91 mmHg  Pulse 89  Temp(Src) 97.8 F (36.6 C) (Oral)  Resp 14  Ht 5\' 9"  (1.753 m)  Wt 225 lb (102.059 kg)  BMI 33.21 kg/m2  SpO2 98%  Physical Exam  Constitutional: He is oriented to person, place, and time. He appears well-developed and well-nourished. No distress.  Abdominal: Soft. He exhibits no distension. There is no tenderness. There is no rebound and no guarding.  Musculoskeletal: He exhibits no edema.  Neurological: He is alert and oriented to person, place, and time.  Skin: Skin is warm and dry. No rash noted. He is not diaphoretic. No erythema. No pallor.  No rash or wound in region of right flank.  Nursing note and vitals reviewed.   Imaging: No results found.  Labs:  CBC: No results for input(s): WBC, HGB, HCT, PLT in the last 8760 hours.  COAGS: No results for input(s): INR, APTT in the last 8760 hours.  BMP:  Recent Labs  07/23/15 1027  BUN 12  CREATININE 1.10  GFRNONAA 62  GFRAA 71    LIVER FUNCTION TESTS: No results for input(s): BILITOT, AST, ALT, ALKPHOS, PROT, ALBUMIN in the last 8760 hours.  TUMOR MARKERS: No results for input(s): AFPTM, CEA, CA199, CHROMGRNA in the last 8760 hours.  Assessment and Plan:  Recent follow-up CT was performed on 09/28/2015 at [facility] This demonstrates an ablation zone in the medial aspect of the upper right kidney completely encompassing the previously enhancing carcinoma. There is no evidence of carcinoma recurrence or abnormal enhancement.  Symptoms of itching and burning in the right flank area are in the region of previous  pain. This again most likely relates to evolving postprocedural neuropathy which appears to be gradually improving. Mr. Bueter is no longer requiring pain medication. I recommended follow-up CT in November, one year post ablation.  Electronically SignedAletta Edouard T 11/03/2015, 3:19 PM   I spent a total of 15 Minutes in face to face in clinical consultation, greater than 50% of which was counseling/coordinating care post cryoablation of a right renal carcinoma.

## 2016-05-20 ENCOUNTER — Encounter: Payer: Self-pay | Admitting: Interventional Radiology

## 2016-06-16 ENCOUNTER — Other Ambulatory Visit: Payer: Self-pay | Admitting: *Deleted

## 2016-06-16 DIAGNOSIS — C649 Malignant neoplasm of unspecified kidney, except renal pelvis: Secondary | ICD-10-CM

## 2016-06-27 ENCOUNTER — Other Ambulatory Visit: Payer: Self-pay | Admitting: Interventional Radiology

## 2016-06-27 DIAGNOSIS — C649 Malignant neoplasm of unspecified kidney, except renal pelvis: Secondary | ICD-10-CM

## 2016-07-26 ENCOUNTER — Other Ambulatory Visit: Payer: Medicare Other

## 2016-08-17 ENCOUNTER — Encounter: Payer: Self-pay | Admitting: Radiology

## 2016-09-14 ENCOUNTER — Encounter: Payer: Self-pay | Admitting: Radiology

## 2016-09-14 ENCOUNTER — Other Ambulatory Visit: Payer: Medicare Other

## 2016-10-04 ENCOUNTER — Other Ambulatory Visit: Payer: Medicare Other

## 2016-10-19 ENCOUNTER — Ambulatory Visit
Admission: RE | Admit: 2016-10-19 | Discharge: 2016-10-19 | Disposition: A | Discharge status: Home / Self Care | Payer: Medicare Other | Source: Ambulatory Visit | Attending: Interventional Radiology | Admitting: Interventional Radiology

## 2016-10-19 DIAGNOSIS — C649 Malignant neoplasm of unspecified kidney, except renal pelvis: Secondary | ICD-10-CM

## 2016-10-19 HISTORY — PX: IR GENERIC HISTORICAL: IMG1180011

## 2016-10-19 NOTE — Progress Notes (Signed)
Referring Physician(s): Dr Aletha Halim  Chief Complaint: The patient is seen in follow up today s/p Status post percutaneous cryoablation of a right renal carcinoma on 07/09/2015.   History of present illness:  Has done well post procedure Denies pain Denies fever Denies urinary issues No change in diet or weight  CT 07/2016: 1. Contracting/regressing post ablation scarring changes involving  the right kidney. No CT findings for residual or recurrent tumor or  metastatic disease.  2. No acute abdominal findings.  Here for follow up with Dr Kathlene Cote  Past Medical History:  Diagnosis Date  . Arthritis   . Gout   . Hydrocele   . Hypercholesteremia   . Hypertension     Past Surgical History:  Procedure Laterality Date  . BACK SURGERY    . HERNIA REPAIR    . IR GENERIC HISTORICAL  10/20/2015   IR RADIOLOGIST EVAL & MGMT 10/20/2015 Aletta Edouard, MD [facility] INTERV RAD    Allergies: Penicillins  Medications: Prior to Admission medications   Medication Sig Start Date End Date Taking? Authorizing Provider  lisinopril (PRINIVIL,ZESTRIL) 10 MG tablet Take 10 mg by mouth daily.   Yes Historical Provider, MD  aspirin EC 81 MG tablet Take 81 mg by mouth daily.    Historical Provider, MD  HYDROcodone-acetaminophen (NORCO/VICODIN) 5-325 MG tablet Take 1 tablet by mouth every 6 (six) hours as needed for moderate pain.    Historical Provider, MD  simvastatin (ZOCOR) 40 MG tablet Take 40 mg by mouth daily.    Historical Provider, MD     No family history on file.  Social History   Social History  . Marital status: Single    Spouse name: N/A  . Number of children: N/A  . Years of education: N/A   Social History Main Topics  . Smoking status: Heavy Tobacco Smoker    Packs/day: 2.00    Types: Cigarettes    Start date: 06/10/1949  . Smokeless tobacco: Former Systems developer    Types: Snuff, Chew  . Alcohol use No  . Drug use: Unknown  . Sexual activity: Not on file    Other Topics Concern  . Not on file   Social History Narrative  . No narrative on file     Vital Signs: BP (!) 157/86 (BP Location: Right Arm, Patient Position: Sitting, Cuff Size: Large)   Pulse 89   Temp 97.7 F (36.5 C) (Oral)   Resp 16   Ht 5\' 9"  (1.753 m)   Wt 225 lb (102.1 kg)   SpO2 95%   BMI 33.23 kg/m   Physical Exam  Constitutional: He is oriented to person, place, and time.  Cardiovascular: Normal rate, regular rhythm and normal heart sounds.   Pulmonary/Chest: Effort normal and breath sounds normal.  Abdominal: Soft. Bowel sounds are normal.  Musculoskeletal: Normal range of motion.  Neurological: He is alert and oriented to person, place, and time.  Skin: Skin is warm and dry.  Psychiatric: He has a normal mood and affect. His behavior is normal.  Nursing note and vitals reviewed.   Imaging: No results found.  Labs:  CBC: No results for input(s): WBC, HGB, HCT, PLT in the last 8760 hours.  COAGS: No results for input(s): INR, APTT in the last 8760 hours.  BMP: No results for input(s): NA, K, CL, CO2, GLUCOSE, BUN, CALCIUM, CREATININE, GFRNONAA, GFRAA in the last 8760 hours.  Invalid input(s): CMP  LIVER FUNCTION TESTS: No results for input(s): BILITOT, AST, ALT, ALKPHOS,  PROT, ALBUMIN in the last 8760 hours.  Assessment:  Right renal mass; renal carcinoma cryoablation 07/10/15 No issues Doing well CT 07/2016 shows no recurrence or evidence of residual tumor Dr Kathlene Cote has reviewed imaging with pt ad anbswered all questios to satisfaction Follow up CT 07/2017. Pt is agreeable to plan  Signed: Laria Grimmett A 10/19/2016, 12:21 PM   Please refer to [place] attestation of this note for management and plan.

## 2016-10-28 ENCOUNTER — Encounter: Payer: Self-pay | Admitting: Interventional Radiology

## 2017-06-01 ENCOUNTER — Other Ambulatory Visit: Payer: Self-pay | Admitting: *Deleted

## 2017-06-01 DIAGNOSIS — C641 Malignant neoplasm of right kidney, except renal pelvis: Secondary | ICD-10-CM

## 2017-06-06 ENCOUNTER — Other Ambulatory Visit: Payer: Self-pay | Admitting: Interventional Radiology

## 2017-06-06 DIAGNOSIS — C641 Malignant neoplasm of right kidney, except renal pelvis: Secondary | ICD-10-CM

## 2017-06-07 ENCOUNTER — Other Ambulatory Visit: Payer: Self-pay | Admitting: *Deleted

## 2017-06-07 DIAGNOSIS — C641 Malignant neoplasm of right kidney, except renal pelvis: Secondary | ICD-10-CM

## 2017-06-08 ENCOUNTER — Encounter: Payer: Self-pay | Admitting: Radiology

## 2017-07-06 ENCOUNTER — Other Ambulatory Visit: Payer: Medicare Other

## 2017-07-06 ENCOUNTER — Encounter: Payer: Self-pay | Admitting: Radiology
